# Patient Record
Sex: Male | Born: 1960 | Race: White | Hispanic: No | Marital: Married | Smoking: Never smoker
Health system: Southern US, Community
[De-identification: ages and names within clinical notes are randomized; demographics above are authoritative.]

## PROBLEM LIST (undated history)

## (undated) DIAGNOSIS — J302 Other seasonal allergic rhinitis: Secondary | ICD-10-CM

## (undated) HISTORY — PX: LEG SURGERY: SHX1003

---

## 2014-04-02 ENCOUNTER — Encounter (HOSPITAL_BASED_OUTPATIENT_CLINIC_OR_DEPARTMENT_OTHER): Payer: Self-pay

## 2014-04-02 ENCOUNTER — Emergency Department (HOSPITAL_BASED_OUTPATIENT_CLINIC_OR_DEPARTMENT_OTHER): Payer: BLUE CROSS/BLUE SHIELD

## 2014-04-02 ENCOUNTER — Inpatient Hospital Stay (HOSPITAL_BASED_OUTPATIENT_CLINIC_OR_DEPARTMENT_OTHER)
Admission: EM | Admit: 2014-04-02 | Discharge: 2014-04-04 | DRG: 193 | Disposition: A | Discharge status: Home / Self Care | Payer: BLUE CROSS/BLUE SHIELD | Attending: Internal Medicine | Admitting: Internal Medicine

## 2014-04-02 DIAGNOSIS — R51 Headache: Secondary | ICD-10-CM | POA: Diagnosis present

## 2014-04-02 DIAGNOSIS — J189 Pneumonia, unspecified organism: Secondary | ICD-10-CM | POA: Diagnosis not present

## 2014-04-02 DIAGNOSIS — I10 Essential (primary) hypertension: Secondary | ICD-10-CM | POA: Diagnosis present

## 2014-04-02 DIAGNOSIS — J029 Acute pharyngitis, unspecified: Secondary | ICD-10-CM | POA: Diagnosis present

## 2014-04-02 DIAGNOSIS — J181 Lobar pneumonia, unspecified organism: Secondary | ICD-10-CM

## 2014-04-02 DIAGNOSIS — J9601 Acute respiratory failure with hypoxia: Secondary | ICD-10-CM | POA: Diagnosis present

## 2014-04-02 DIAGNOSIS — R05 Cough: Secondary | ICD-10-CM | POA: Diagnosis not present

## 2014-04-02 HISTORY — DX: Other seasonal allergic rhinitis: J30.2

## 2014-04-02 LAB — D-DIMER, QUANTITATIVE: D-Dimer, Quant: 0.83 ug/mL-FEU — ABNORMAL HIGH (ref 0.00–0.48)

## 2014-04-02 LAB — BASIC METABOLIC PANEL
ANION GAP: 10 (ref 5–15)
BUN: 10 mg/dL (ref 6–23)
CHLORIDE: 101 mmol/L (ref 96–112)
CO2: 26 mmol/L (ref 19–32)
Calcium: 8.5 mg/dL (ref 8.4–10.5)
Creatinine, Ser: 0.86 mg/dL (ref 0.50–1.35)
GFR calc non Af Amer: >90 mL/min (ref >90)
Glucose, Bld: 108 mg/dL — ABNORMAL HIGH (ref 70–99)
Potassium: 3.4 mmol/L — ABNORMAL LOW (ref 3.5–5.1)
SODIUM: 137 mmol/L (ref 135–145)

## 2014-04-02 LAB — TROPONIN I: Troponin I: <0.03 ng/mL (ref <0.031)

## 2014-04-02 LAB — CBC
HCT: 41 % (ref 39.0–52.0)
HEMOGLOBIN: 13.7 g/dL (ref 13.0–17.0)
MCH: 30.2 pg (ref 26.0–34.0)
MCHC: 33.4 g/dL (ref 30.0–36.0)
MCV: 90.5 fL (ref 78.0–100.0)
Platelets: 212 10*3/uL (ref 150–400)
RBC: 4.53 MIL/uL (ref 4.22–5.81)
RDW: 13.6 % (ref 11.5–15.5)
WBC: 4.5 10*3/uL (ref 4.0–10.5)

## 2014-04-02 LAB — BRAIN NATRIURETIC PEPTIDE: B NATRIURETIC PEPTIDE 5: 50.3 pg/mL (ref 0.0–100.0)

## 2014-04-02 MED ORDER — SODIUM CHLORIDE 0.9 % IV SOLN
1000.0000 mL | Freq: Once | INTRAVENOUS | Status: AC
  Administered 2014-04-03: 1000 mL via INTRAVENOUS

## 2014-04-02 MED ORDER — METHYLPREDNISOLONE SODIUM SUCC 125 MG IJ SOLR
125.0000 mg | Freq: Once | INTRAMUSCULAR | Status: AC
  Administered 2014-04-02: 125 mg via INTRAVENOUS
  Filled 2014-04-02: qty 2

## 2014-04-02 MED ORDER — AZITHROMYCIN 500 MG IV SOLR
500.0000 mg | Freq: Once | INTRAVENOUS | Status: AC
  Administered 2014-04-02: 500 mg via INTRAVENOUS
  Filled 2014-04-02: qty 500

## 2014-04-02 MED ORDER — SODIUM CHLORIDE 0.9 % IV SOLN
1000.0000 mL | INTRAVENOUS | Status: DC
  Administered 2014-04-03: 1000 mL via INTRAVENOUS

## 2014-04-02 MED ORDER — IPRATROPIUM-ALBUTEROL 0.5-2.5 (3) MG/3ML IN SOLN
3.0000 mL | RESPIRATORY_TRACT | Status: DC
  Filled 2014-04-02: qty 3

## 2014-04-02 MED ORDER — CEFTRIAXONE SODIUM 1 G IJ SOLR
INTRAMUSCULAR | Status: AC
  Filled 2014-04-02: qty 10

## 2014-04-02 MED ORDER — ACETAMINOPHEN 500 MG PO TABS
1000.0000 mg | ORAL_TABLET | Freq: Once | ORAL | Status: AC
  Administered 2014-04-02: 1000 mg via ORAL
  Filled 2014-04-02: qty 2

## 2014-04-02 MED ORDER — DEXTROSE 5 % IV SOLN
1.0000 g | Freq: Once | INTRAVENOUS | Status: AC
  Administered 2014-04-02: 1 g via INTRAVENOUS

## 2014-04-02 MED ORDER — IPRATROPIUM-ALBUTEROL 0.5-2.5 (3) MG/3ML IN SOLN
3.0000 mL | Freq: Once | RESPIRATORY_TRACT | Status: AC
  Administered 2014-04-02: 3 mL via RESPIRATORY_TRACT

## 2014-04-02 MED ORDER — IPRATROPIUM-ALBUTEROL 0.5-2.5 (3) MG/3ML IN SOLN
3.0000 mL | Freq: Once | RESPIRATORY_TRACT | Status: AC
  Administered 2014-04-02: 3 mL via RESPIRATORY_TRACT
  Filled 2014-04-02: qty 3

## 2014-04-02 MED ORDER — IOHEXOL 350 MG/ML SOLN
100.0000 mL | Freq: Once | INTRAVENOUS | Status: AC | PRN
  Administered 2014-04-02: 100 mL via INTRAVENOUS
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
## 2014-04-02 NOTE — ED Notes (Signed)
Patient transported to X-ray 

## 2014-04-02 NOTE — ED Notes (Signed)
Pt fed per EDP order.  

## 2014-04-03 ENCOUNTER — Encounter (HOSPITAL_BASED_OUTPATIENT_CLINIC_OR_DEPARTMENT_OTHER): Payer: Self-pay | Admitting: Internal Medicine

## 2014-04-03 DIAGNOSIS — J189 Pneumonia, unspecified organism: Secondary | ICD-10-CM | POA: Diagnosis present

## 2014-04-03 DIAGNOSIS — R05 Cough: Secondary | ICD-10-CM | POA: Diagnosis present

## 2014-04-03 DIAGNOSIS — J181 Lobar pneumonia, unspecified organism: Secondary | ICD-10-CM

## 2014-04-03 DIAGNOSIS — J029 Acute pharyngitis, unspecified: Secondary | ICD-10-CM | POA: Diagnosis present

## 2014-04-03 DIAGNOSIS — J9601 Acute respiratory failure with hypoxia: Secondary | ICD-10-CM | POA: Diagnosis present

## 2014-04-03 DIAGNOSIS — I10 Essential (primary) hypertension: Secondary | ICD-10-CM | POA: Diagnosis present

## 2014-04-03 DIAGNOSIS — R51 Headache: Secondary | ICD-10-CM | POA: Diagnosis present

## 2014-04-03 LAB — INFLUENZA PANEL BY PCR (TYPE A & B)
H1N1 flu by pcr: NOT DETECTED
INFLAPCR: NEGATIVE
Influenza B By PCR: NEGATIVE

## 2014-04-03 LAB — STREP PNEUMONIAE URINARY ANTIGEN: STREP PNEUMO URINARY ANTIGEN: NEGATIVE

## 2014-04-03 LAB — HIV ANTIBODY (ROUTINE TESTING W REFLEX): HIV SCREEN 4TH GENERATION: NONREACTIVE

## 2014-04-03 MED ORDER — GUAIFENESIN ER 600 MG PO TB12
600.0000 mg | ORAL_TABLET | Freq: Two times a day (BID) | ORAL | Status: DC
  Administered 2014-04-03 – 2014-04-04 (×3): 600 mg via ORAL
  Filled 2014-04-03 (×4): qty 1

## 2014-04-03 MED ORDER — SODIUM CHLORIDE 0.9 % IV SOLN
INTRAVENOUS | Status: DC
  Administered 2014-04-03 (×2): via INTRAVENOUS

## 2014-04-03 MED ORDER — MENTHOL 3 MG MT LOZG
1.0000 | LOZENGE | OROMUCOSAL | Status: DC | PRN
  Administered 2014-04-03: 3 mg via ORAL
  Filled 2014-04-03: qty 9

## 2014-04-03 MED ORDER — PHENOL 1.4 % MT LIQD
1.0000 | OROMUCOSAL | Status: DC | PRN
  Filled 2014-04-03: qty 177

## 2014-04-03 MED ORDER — HEPARIN SODIUM (PORCINE) 5000 UNIT/ML IJ SOLN
5000.0000 [IU] | Freq: Three times a day (TID) | INTRAMUSCULAR | Status: DC
  Administered 2014-04-03 – 2014-04-04 (×4): 5000 [IU] via SUBCUTANEOUS
  Filled 2014-04-03 (×6): qty 1

## 2014-04-03 MED ORDER — ASPIRIN 325 MG PO TABS
325.0000 mg | ORAL_TABLET | Freq: Every day | ORAL | Status: DC
  Administered 2014-04-03 – 2014-04-04 (×2): 325 mg via ORAL
  Filled 2014-04-03 (×2): qty 1

## 2014-04-03 MED ORDER — PANTOPRAZOLE SODIUM 40 MG PO TBEC
40.0000 mg | DELAYED_RELEASE_TABLET | Freq: Every day | ORAL | Status: DC
  Administered 2014-04-03 – 2014-04-04 (×2): 40 mg via ORAL
  Filled 2014-04-03 (×2): qty 1

## 2014-04-03 MED ORDER — ACETAMINOPHEN 325 MG PO TABS
650.0000 mg | ORAL_TABLET | Freq: Four times a day (QID) | ORAL | Status: DC | PRN
  Administered 2014-04-03 (×2): 650 mg via ORAL
  Filled 2014-04-03 (×2): qty 2

## 2014-04-03 MED ORDER — AZITHROMYCIN 500 MG PO TABS
500.0000 mg | ORAL_TABLET | ORAL | Status: DC
  Administered 2014-04-03: 500 mg via ORAL
  Filled 2014-04-03 (×2): qty 1

## 2014-04-03 MED ORDER — POTASSIUM CHLORIDE CRYS ER 20 MEQ PO TBCR
40.0000 meq | EXTENDED_RELEASE_TABLET | Freq: Once | ORAL | Status: AC
  Administered 2014-04-03: 40 meq via ORAL
  Filled 2014-04-03: qty 2

## 2014-04-03 MED ORDER — SODIUM CHLORIDE 0.9 % IV SOLN
INTRAVENOUS | Status: DC
  Administered 2014-04-03: 04:00:00 via INTRAVENOUS

## 2014-04-03 MED ORDER — CEFTRIAXONE SODIUM IN DEXTROSE 20 MG/ML IV SOLN
1.0000 g | INTRAVENOUS | Status: DC
  Administered 2014-04-03: 1 g via INTRAVENOUS
  Filled 2014-04-03 (×3): qty 50

## 2014-04-03 MED ORDER — LORATADINE 10 MG PO TABS
10.0000 mg | ORAL_TABLET | Freq: Every day | ORAL | Status: DC
  Administered 2014-04-03 – 2014-04-04 (×2): 10 mg via ORAL
  Filled 2014-04-03 (×2): qty 1

## 2014-04-03 MED ORDER — IBUPROFEN 200 MG PO TABS
400.0000 mg | ORAL_TABLET | ORAL | Status: DC | PRN
Start: 2014-04-03 — End: 2014-04-04
  Administered 2014-04-03 (×2): 400 mg via ORAL
  Filled 2014-04-03: qty 2

## 2014-04-03 MED ORDER — LEVALBUTEROL HCL 0.63 MG/3ML IN NEBU
0.6300 mg | INHALATION_SOLUTION | Freq: Four times a day (QID) | RESPIRATORY_TRACT | Status: DC | PRN
  Administered 2014-04-03: 0.63 mg via RESPIRATORY_TRACT
  Filled 2014-04-03: qty 3

## 2014-04-03 NOTE — Progress Notes (Signed)
Pt ambulated 500 ft in halls at brisk pace on room air. O2 sat remained 97% or greater.

## 2014-04-03 NOTE — Progress Notes (Signed)
Utilization Review Completed.   Makenize Messman, RN, BSN Nurse Case Manager  
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
## 2014-04-03 NOTE — Progress Notes (Signed)
Pt ambulated with wife and daughter without any shortness of breath, on room air with O2 sat maintaining 100%.
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
## 2014-04-04 LAB — BASIC METABOLIC PANEL
Anion gap: 9 (ref 5–15)
BUN: 10 mg/dL (ref 6–23)
CALCIUM: 8.4 mg/dL (ref 8.4–10.5)
CO2: 26 mmol/L (ref 19–32)
CREATININE: 0.82 mg/dL (ref 0.50–1.35)
Chloride: 106 mmol/L (ref 96–112)
GFR calc Af Amer: >90 mL/min (ref >90)
GLUCOSE: 113 mg/dL — AB (ref 70–99)
Potassium: 4 mmol/L (ref 3.5–5.1)
SODIUM: 141 mmol/L (ref 135–145)

## 2014-04-04 LAB — CBC
HCT: 37.9 % — ABNORMAL LOW (ref 39.0–52.0)
HEMOGLOBIN: 12.2 g/dL — AB (ref 13.0–17.0)
MCH: 29.5 pg (ref 26.0–34.0)
MCHC: 32.2 g/dL (ref 30.0–36.0)
MCV: 91.5 fL (ref 78.0–100.0)
Platelets: 229 10*3/uL (ref 150–400)
RBC: 4.14 MIL/uL — ABNORMAL LOW (ref 4.22–5.81)
RDW: 14.4 % (ref 11.5–15.5)
WBC: 6.1 10*3/uL (ref 4.0–10.5)

## 2014-04-04 LAB — LEGIONELLA ANTIGEN, URINE

## 2014-04-04 MED ORDER — LEVOFLOXACIN 500 MG PO TABS: 500.0000 mg | ORAL_TABLET | Freq: Every day | ORAL | Status: AC

## 2014-04-04 MED ORDER — LEVOFLOXACIN 500 MG PO TABS
500.0000 mg | ORAL_TABLET | Freq: Every day | ORAL | Status: DC
  Administered 2014-04-04: 500 mg via ORAL
  Filled 2014-04-04 (×3): qty 1

## 2014-04-04 MED ORDER — GUAIFENESIN ER 600 MG PO TB12: 600.0000 mg | ORAL_TABLET | Freq: Two times a day (BID) | ORAL | Status: AC

## 2014-04-04 MED ORDER — AMLODIPINE BESYLATE 5 MG PO TABS: 5.0000 mg | ORAL_TABLET | Freq: Every day | ORAL | Status: AC

## 2014-04-04 NOTE — Progress Notes (Signed)
Discharge instructions reviewed with patient. Patient educated about new BP medication and when he should start taking it along with side effects to watch for. Patient verbalized understanding and all questions answered. IV discontinued and patient discharged home with wife via personal vehicle.
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
## 2014-04-04 NOTE — Discharge Instructions (Signed)
Hypertension °Hypertension, commonly called high blood pressure, is when the force of blood pumping through your arteries is too strong. Your arteries are the blood vessels that carry blood from your heart throughout your body. A blood pressure reading consists of a higher number over a lower number, such as 110/72. The higher number (systolic) is the pressure inside your arteries when your heart pumps. The lower number (diastolic) is the pressure inside your arteries when your heart relaxes. Ideally you want your blood pressure below 120/80. °Hypertension forces your heart to work harder to pump blood. Your arteries may become narrow or stiff. Having hypertension puts you at risk for heart disease, stroke, and other problems.  °RISK FACTORS °Some risk factors for high blood pressure are controllable. Others are not.  °Risk factors you cannot control include:  °· Race. You may be at higher risk if you are African American. °· Age. Risk increases with age. °· Gender. Men are at higher risk than women before age 45 years. After age 65, women are at higher risk than men. °Risk factors you can control include: °· Not getting enough exercise or physical activity. °· Being overweight. °· Getting too much fat, sugar, calories, or salt in your diet. °· Drinking too much alcohol. °SIGNS AND SYMPTOMS °Hypertension does not usually cause signs or symptoms. Extremely high blood pressure (hypertensive crisis) may cause headache, anxiety, shortness of breath, and nosebleed. °DIAGNOSIS  °To check if you have hypertension, your health care provider will measure your blood pressure while you are seated, with your arm held at the level of your heart. It should be measured at least twice using the same arm. Certain conditions can cause a difference in blood pressure between your right and left arms. A blood pressure reading that is higher than normal on one occasion does not mean that you need treatment. If one blood pressure reading  is high, ask your health care provider about having it checked again. °TREATMENT  °Treating high blood pressure includes making lifestyle changes and possibly taking medicine. Living a healthy lifestyle can help lower high blood pressure. You may need to change some of your habits. °Lifestyle changes may include: °· Following the DASH diet. This diet is high in fruits, vegetables, and whole grains. It is low in salt, red meat, and added sugars. °· Getting at least 2½ hours of brisk physical activity every week. °· Losing weight if necessary. °· Not smoking. °· Limiting alcoholic beverages. °· Learning ways to reduce stress. ° If lifestyle changes are not enough to get your blood pressure under control, your health care provider may prescribe medicine. You may need to take more than one. Work closely with your health care provider to understand the risks and benefits. °HOME CARE INSTRUCTIONS °· Have your blood pressure rechecked as directed by your health care provider.   °· Take medicines only as directed by your health care provider. Follow the directions carefully. Blood pressure medicines must be taken as prescribed. The medicine does not work as well when you skip doses. Skipping doses also puts you at risk for problems.   °· Do not smoke.   °· Monitor your blood pressure at home as directed by your health care provider.  °SEEK MEDICAL CARE IF:  °· You think you are having a reaction to medicines taken. °· You have recurrent headaches or feel dizzy. °· You have swelling in your ankles. °· You have trouble with your vision. °SEEK IMMEDIATE MEDICAL CARE IF: °· You develop a severe headache or confusion. °·   You have unusual weakness, numbness, or feel faint.  You have severe chest or abdominal pain.  You vomit repeatedly.  You have trouble breathing. MAKE SURE YOU:   Understand these instructions.  Will watch your condition.  Will get help right away if you are not doing well or get worse. Document  Released: 01/07/2005 Document Revised: 05/24/2013 Document Reviewed: 10/30/2012 [facility] Patient Information 2015 [place], [place]. This information is not intended to replace advice given to you by your health care provider. Make sure you discuss any questions you have with your health care provider.   Pneumonia Pneumonia is an infection of the lungs.  CAUSES Pneumonia may be caused by bacteria or a virus. Usually, these infections are caused by breathing infectious particles into the lungs (respiratory tract). SIGNS AND SYMPTOMS   Cough.  Fever.  Chest pain.  Increased rate of breathing.  Wheezing.  Mucus production. DIAGNOSIS  If you have the common symptoms of pneumonia, your health care provider will typically confirm the diagnosis with a chest X-ray. The X-ray will show an abnormality in the lung (pulmonary infiltrate) if you have pneumonia. Other tests of your blood, urine, or sputum may be done to find the specific cause of your pneumonia. Your health care provider may also do tests (blood gases or pulse oximetry) to see how well your lungs are working. TREATMENT  Some forms of pneumonia may be spread to other people when you cough or sneeze. You may be asked to wear a mask before and during your exam. Pneumonia that is caused by bacteria is treated with antibiotic medicine. Pneumonia that is caused by the influenza virus may be treated with an antiviral medicine. Most other viral infections must run their course. These infections will not respond to antibiotics.  HOME CARE INSTRUCTIONS   Cough suppressants may be used if you are losing too much rest. However, coughing protects you by clearing your lungs. You should avoid using cough suppressants if you can.  Your health care provider may have prescribed medicine if he or she thinks your pneumonia is caused by bacteria or influenza. Finish your medicine even if you start to feel better.  Your health care provider may also  prescribe an expectorant. This loosens the mucus to be coughed up.  Take medicines only as directed by your health care provider.  Do not smoke. Smoking is a common cause of bronchitis and can contribute to pneumonia. If you are a smoker and continue to smoke, your cough may last several weeks after your pneumonia has cleared.  A cold steam vaporizer or humidifier in your room or home may help loosen mucus.  Coughing is often worse at night. Sleeping in a semi-upright position in a recliner or using a couple pillows under your head will help with this.  Get rest as you feel it is needed. Your body will usually let you know when you need to rest. PREVENTION A pneumococcal shot (vaccine) is available to prevent a common bacterial cause of pneumonia. This is usually suggested for:  People over 59 years old.  Patients on chemotherapy.  People with chronic lung problems, such as bronchitis or emphysema.  People with immune system problems. If you are over 65 or have a high risk condition, you may receive the pneumococcal vaccine if you have not received it before. In some countries, a routine influenza vaccine is also recommended. This vaccine can help prevent some cases of pneumonia.You may be offered the influenza vaccine as part of your  care. If you smoke, it is time to quit. You may receive instructions on how to stop smoking. Your health care provider can provide medicines and counseling to help you quit. SEEK MEDICAL CARE IF: You have a fever. SEEK IMMEDIATE MEDICAL CARE IF:   Your illness becomes worse. This is especially true if you are elderly or weakened from any other disease.  You cannot control your cough with suppressants and are losing sleep.  You begin coughing up blood.  You develop pain which is getting worse or is uncontrolled with medicines.  Any of the symptoms which initially brought you in for treatment are getting worse rather than better.  You develop  shortness of breath or chest pain. MAKE SURE YOU:   Understand these instructions.  Will watch your condition.  Will get help right away if you are not doing well or get worse. Document Released: 01/07/2005 Document Revised: 05/24/2013 Document Reviewed: 03/29/2010 [facility] Patient Information 2015 [place], [place]. This information is not intended to replace advice given to you by your health care provider. Make sure you discuss any questions you have with your health care provider.  Amlodipine tablets What is this medicine? AMLODIPINE (am LOE di peen) is a calcium-channel blocker. It affects the amount of calcium found in your heart and muscle cells. This relaxes your blood vessels, which can reduce the amount of work the heart has to do. This medicine is used to lower high blood pressure. It is also used to prevent chest pain. This medicine may be used for other purposes; ask your health care provider or pharmacist if you have questions. COMMON BRAND NAME(S): Norvasc What should I tell my health care provider before I take this medicine? They need to know if you have any of these conditions: -heart problems like heart failure or aortic stenosis -liver disease -an unusual or allergic reaction to amlodipine, other medicines, foods, dyes, or preservatives -pregnant or trying to get pregnant -breast-feeding How should I use this medicine? Take this medicine by mouth with a glass of water. Follow the directions on the prescription label. Take your medicine at regular intervals. Do not take more medicine than directed. Talk to your pediatrician regarding the use of this medicine in children. Special care may be needed. This medicine has been used in children as young as 6. Persons over 28 years old may have a stronger reaction to this medicine and need smaller doses. Overdosage: If you think you have taken too much of this medicine contact a poison control center or emergency room at  once. NOTE: This medicine is only for you. Do not share this medicine with others. What if I miss a dose? If you miss a dose, take it as soon as you can. If it is almost time for your next dose, take only that dose. Do not take double or extra doses. What may interact with this medicine? -herbal or dietary supplements -local or general anesthetics -medicines for high blood pressure -medicines for prostate problems -rifampin This list may not describe all possible interactions. Give your health care provider a list of all the medicines, herbs, non-prescription drugs, or dietary supplements you use. Also tell them if you smoke, drink alcohol, or use illegal drugs. Some items may interact with your medicine. What should I watch for while using this medicine? Visit your doctor or health care professional for regular check ups. Check your blood pressure and pulse rate regularly. Ask your health care professional what your blood pressure and pulse  rate should be, and when you should contact him or her. This medicine may make you feel confused, dizzy or lightheaded. Do not drive, use machinery, or do anything that needs mental alertness until you know how this medicine affects you. To reduce the risk of dizzy or fainting spells, do not sit or stand up quickly, especially if you are an older patient. Avoid alcoholic drinks; they can make you more dizzy. Do not suddenly stop taking amlodipine. Ask your doctor or health care professional how you can gradually reduce the dose. What side effects may I notice from receiving this medicine? Side effects that you should report to your doctor or health care professional as soon as possible: -allergic reactions like skin rash, itching or hives, swelling of the face, lips, or tongue -breathing problems -changes in vision or hearing -chest pain -fast, irregular heartbeat -swelling of legs or ankles Side effects that usually do not require medical attention  (report to your doctor or health care professional if they continue or are bothersome): -dry mouth -facial flushing -nausea, vomiting -stomach gas, pain -tired, weak -trouble sleeping This list may not describe all possible side effects. Call your doctor for medical advice about side effects. You may report side effects to FDA at 1-800-FDA-1088. Where should I keep my medicine? Keep out of the reach of children. Store at room temperature between 59 and 86 degrees F (15 and 30 degrees C). Protect from light. Keep container tightly closed. Throw away any unused medicine after the expiration date. NOTE: This sheet is a summary. It may not cover all possible information. If you have questions about this medicine, talk to your doctor, pharmacist, or health care provider.  2015, [employer]. (2011-12-06 11:40:58)

## 2014-04-09 LAB — CULTURE, BLOOD (ROUTINE X 2)
CULTURE: NO GROWTH
CULTURE: NO GROWTH
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
